# Patient Record
Sex: Female | Born: 1970 | Race: White | Hispanic: No | Marital: Married | Smoking: Never smoker
Health system: Southern US, Community
[De-identification: ages and names within clinical notes are randomized; demographics above are authoritative.]

---

## 2008-03-28 ENCOUNTER — Ambulatory Visit: Payer: Self-pay | Admitting: Obstetrics and Gynecology

## 2011-10-27 ENCOUNTER — Ambulatory Visit: Payer: Self-pay

## 2013-03-10 ENCOUNTER — Ambulatory Visit: Payer: Self-pay | Admitting: Obstetrics and Gynecology

## 2016-08-22 ENCOUNTER — Other Ambulatory Visit: Payer: Self-pay | Admitting: Obstetrics and Gynecology

## 2016-08-22 DIAGNOSIS — Z1231 Encounter for screening mammogram for malignant neoplasm of breast: Secondary | ICD-10-CM

## 2016-09-02 ENCOUNTER — Encounter: Payer: Self-pay | Admitting: Radiology

## 2016-09-02 ENCOUNTER — Encounter (INDEPENDENT_AMBULATORY_CARE_PROVIDER_SITE_OTHER): Payer: Self-pay

## 2016-09-02 ENCOUNTER — Ambulatory Visit
Admission: RE | Admit: 2016-09-02 | Discharge: 2016-09-02 | Disposition: A | Discharge status: Home / Self Care | Payer: BLUE CROSS/BLUE SHIELD | Source: Ambulatory Visit | Attending: Obstetrics and Gynecology | Admitting: Obstetrics and Gynecology

## 2016-09-02 DIAGNOSIS — Z1231 Encounter for screening mammogram for malignant neoplasm of breast: Secondary | ICD-10-CM

## 2016-09-23 ENCOUNTER — Telehealth: Payer: Self-pay | Admitting: Oncology

## 2016-09-23 NOTE — Telephone Encounter (Signed)
Consultation for LEUKOPENIA. Ref by Dr. Elenora GammaSchermerhorn,Janse. Office notified (s/w Vernona RiegerLaura). New Patient packet mailed to pt. Notes in book. MF

## 2016-10-02 DIAGNOSIS — D72819 Decreased white blood cell count, unspecified: Secondary | ICD-10-CM | POA: Insufficient documentation

## 2016-10-03 ENCOUNTER — Inpatient Hospital Stay: Payer: BLUE CROSS/BLUE SHIELD | Attending: Oncology | Admitting: Oncology

## 2016-10-03 ENCOUNTER — Inpatient Hospital Stay: Payer: BLUE CROSS/BLUE SHIELD

## 2016-10-03 ENCOUNTER — Encounter: Payer: Self-pay | Admitting: Oncology

## 2016-10-03 DIAGNOSIS — D72819 Decreased white blood cell count, unspecified: Secondary | ICD-10-CM

## 2016-10-03 LAB — COMPREHENSIVE METABOLIC PANEL
ALBUMIN: 4.6 g/dL (ref 3.5–5.0)
ALT: 13 U/L — AB (ref 14–54)
AST: 18 U/L (ref 15–41)
Alkaline Phosphatase: 50 U/L (ref 38–126)
Anion gap: 8 (ref 5–15)
BILIRUBIN TOTAL: 0.7 mg/dL (ref 0.3–1.2)
BUN: 11 mg/dL (ref 6–20)
CO2: 25 mmol/L (ref 22–32)
CREATININE: 0.47 mg/dL (ref 0.44–1.00)
Calcium: 9.1 mg/dL (ref 8.9–10.3)
Chloride: 103 mmol/L (ref 101–111)
GFR calc Af Amer: >60 mL/min (ref >60)
GLUCOSE: 87 mg/dL (ref 65–99)
POTASSIUM: 3.7 mmol/L (ref 3.5–5.1)
Sodium: 136 mmol/L (ref 135–145)
TOTAL PROTEIN: 7.7 g/dL (ref 6.5–8.1)

## 2016-10-03 LAB — CBC WITH DIFFERENTIAL/PLATELET
BASOS ABS: 0.2 10*3/uL — AB (ref 0–0.1)
BASOS PCT: 2 %
Eosinophils Absolute: 0.1 10*3/uL (ref 0–0.7)
Eosinophils Relative: 2 %
HEMATOCRIT: 37.2 % (ref 35.0–47.0)
HEMOGLOBIN: 13.3 g/dL (ref 12.0–16.0)
LYMPHS PCT: 26 %
Lymphs Abs: 1.7 10*3/uL (ref 1.0–3.6)
MCH: 34.4 pg — ABNORMAL HIGH (ref 26.0–34.0)
MCHC: 35.7 g/dL (ref 32.0–36.0)
MCV: 96.4 fL (ref 80.0–100.0)
Monocytes Absolute: 0.5 10*3/uL (ref 0.2–0.9)
Monocytes Relative: 8 %
NEUTROS ABS: 4.2 10*3/uL (ref 1.4–6.5)
NEUTROS PCT: 62 %
Platelets: 326 10*3/uL (ref 150–440)
RBC: 3.86 MIL/uL (ref 3.80–5.20)
RDW: 12.6 % (ref 11.5–14.5)
WBC: 6.8 10*3/uL (ref 3.6–11.0)

## 2016-10-03 LAB — TECHNOLOGIST SMEAR REVIEW

## 2016-10-03 LAB — LACTATE DEHYDROGENASE: LDH: 119 U/L (ref 98–192)

## 2016-10-03 NOTE — Progress Notes (Signed)
Patient here today as a new patient  

## 2016-10-04 LAB — HEPATITIS PANEL, ACUTE
HCV Ab: <0.1 s/co ratio (ref 0.0–0.9)
HEP B C IGM: NEGATIVE
HEP B S AG: NEGATIVE
Hep A IgM: NEGATIVE

## 2016-10-04 LAB — HIV ANTIBODY (ROUTINE TESTING W REFLEX): HIV Screen 4th Generation wRfx: NONREACTIVE

## 2016-10-04 LAB — ANTINUCLEAR ANTIBODIES, IFA: ANA Ab, IFA: NEGATIVE

## 2016-10-05 NOTE — Progress Notes (Signed)
Hematology/Oncology Consult note [facility] Telephone:([phone] [phone]  CONSULT NOTE Patient Care Team: Marina Goodell, MD as PCP - General (Family Medicine)  CHIEF COMPLAINTS/PURPOSE OF CONSULTATION:  My doctor tells me I have low white blood counts.    HISTORY OF PRESENTING ILLNESS:  Sara Stanton 46 y.o.  female who is referred to me by Dr.Schermerhorn for evaluation of leukopenia.  Patient feels well, at her usual health state. She has no complaints. Denies fever chills, weight loss, night sweats, lumps, chest pain, sob, abd pain, or lower extremity swelling. She denies taking any medication including herbal medication.     ROS:  Review of Systems  Constitutional: Negative.   HENT:  Negative.   Eyes: Negative.   Respiratory: Negative.   Cardiovascular: Negative.   Gastrointestinal: Negative.   Endocrine: Negative.   Genitourinary: Negative.    Musculoskeletal: Negative.   Skin: Negative.   Neurological: Negative.   Hematological: Negative.   Psychiatric/Behavioral: Negative.     MEDICAL HISTORY:  History reviewed. No pertinent past medical history.  SURGICAL HISTORY: History reviewed. No pertinent surgical history.  SOCIAL HISTORY: Social History   Social History  . Marital status: Married    Spouse name: N/A  . Number of children: N/A  . Years of education: N/A   Occupational History  . Not on file.   Social History Main Topics  . Smoking status: Never Smoker  . Smokeless tobacco: Never Used  . Alcohol use 1.2 oz/week    2 Cans of beer per week  . Drug use: No  . Sexual activity: Yes    Birth control/ protection: IUD   Other Topics Concern  . Not on file   Social History Narrative  . No narrative on file    FAMILY HISTORY: Family History  Problem Relation Age of Onset  . Thyroid cancer Mother   . Diabetes Maternal Grandfather   . Colon cancer Paternal Grandfather   . Breast cancer Neg Hx      ALLERGIES:  has No Known Allergies.  MEDICATIONS:  Current Outpatient Prescriptions  Medication Sig Dispense Refill  . levonorgestrel (MIRENA) 20 MCG/24HR IUD by Intrauterine route.     No current facility-administered medications for this visit.       Marland Kitchen  PHYSICAL EXAMINATION: ECOG PERFORMANCE STATUS: 0 - Asymptomatic Vitals:   10/03/16 1510  BP: 121/73  Pulse: 75  Resp: 16  Temp: 99.2 F (37.3 C)   Filed Weights   10/03/16 1510  Weight: 121 lb (54.9 kg)    GENERAL: Well-nourished well-developed; Alert, no distress and comfortable.  EYES: no pallor or icterus OROPHARYNX: no thrush or ulceration; good dentition  NECK: supple, no masses felt LYMPH:  no palpable lymphadenopathy in the cervical, axillary or inguinal regions LUNGS: clear to auscultation and  No wheeze or crackles HEART/CVS: regular rate & rhythm and no murmurs; No lower extremity edema ABDOMEN: abdomen soft, non-tender and normal bowel sounds Musculoskeletal:no cyanosis of digits and no clubbing  PSYCH: alert & oriented x 3  NEURO: no focal motor/sensory deficits SKIN:  no rashes or significant lesions  LABORATORY DATA:  I have reviewed the data as listed 08/22/2016 WBC 3.9, Hb 13.4, platelet 319, normal differential 09/18/2016 WBC 4, Hb 13.5, platelet 332,normal differential   RADIOGRAPHIC STUDIES: I have personally reviewed the radiological images as listed and agreed with the findings in the report. No recent images.   ASSESSMENT & PLAN:  1. Leukopenia, unspecified type   . Repeat  cbc w diff, CMP, ANA, neutrophil antibody, hepatitis panel, HIV.  Mild leukopenia mild be reactive. Patient prefers to be called if results are all normal.   All questions were answered. The patient knows to call the clinic with any problems questions or concerns.  Return of visit: as needed.  Thank you for this kind referral and the opportunity to participate in the care of this patient. A copy of today's note  is routed to referring provider    Rickard Patience, MD, PhD Hematology Oncology [facility] Pager- [phone] 10/05/2016

## 2016-10-08 LAB — NEUTROPHIL AB TEST LEVEL 1: NEUTROPHIL SCR/PANEL INTERP.: NEGATIVE

## 2016-10-10 ENCOUNTER — Telehealth: Payer: Self-pay | Admitting: Oncology

## 2016-10-10 ENCOUNTER — Other Ambulatory Visit: Payer: Self-pay | Admitting: Oncology

## 2016-10-10 DIAGNOSIS — R7989 Other specified abnormal findings of blood chemistry: Secondary | ICD-10-CM

## 2016-10-10 NOTE — Progress Notes (Signed)
Results called to patient.  Repeat cbc in 4-6 months.

## 2016-10-10 NOTE — Telephone Encounter (Signed)
-----   Message from Rickard PatienceZhou Paxtyn Wisdom, MD sent at 10/03/2016  3:23 PM EDT ----- Call with labs.

## 2016-11-01 ENCOUNTER — Encounter: Payer: Self-pay | Admitting: Oncology

## 2017-01-31 ENCOUNTER — Telehealth: Payer: Self-pay | Admitting: Oncology

## 2017-02-06 ENCOUNTER — Other Ambulatory Visit: Payer: BLUE CROSS/BLUE SHIELD

## 2017-02-06 ENCOUNTER — Inpatient Hospital Stay: Payer: BLUE CROSS/BLUE SHIELD | Attending: Oncology

## 2017-02-06 DIAGNOSIS — D72819 Decreased white blood cell count, unspecified: Secondary | ICD-10-CM | POA: Diagnosis present

## 2017-02-06 DIAGNOSIS — R7989 Other specified abnormal findings of blood chemistry: Secondary | ICD-10-CM

## 2017-02-06 LAB — CBC WITH DIFFERENTIAL/PLATELET
Basophils Absolute: 0.1 10*3/uL (ref 0–0.1)
Basophils Relative: 1 %
Eosinophils Absolute: 0.3 10*3/uL (ref 0–0.7)
Eosinophils Relative: 5 %
HEMATOCRIT: 37.6 % (ref 35.0–47.0)
HEMOGLOBIN: 13 g/dL (ref 12.0–16.0)
LYMPHS PCT: 29 %
Lymphs Abs: 1.4 10*3/uL (ref 1.0–3.6)
MCH: 33.7 pg (ref 26.0–34.0)
MCHC: 34.5 g/dL (ref 32.0–36.0)
MCV: 97.7 fL (ref 80.0–100.0)
MONO ABS: 0.5 10*3/uL (ref 0.2–0.9)
Monocytes Relative: 9 %
NEUTROS ABS: 2.7 10*3/uL (ref 1.4–6.5)
NEUTROS PCT: 56 %
Platelets: 334 10*3/uL (ref 150–440)
RBC: 3.85 MIL/uL (ref 3.80–5.20)
RDW: 12.6 % (ref 11.5–14.5)
WBC: 5 10*3/uL (ref 3.6–11.0)

## 2017-02-06 NOTE — Telephone Encounter (Signed)
Cbc results communicated with patient.  Patient was advised to continue follow up with PCP. In the future, she can call us if she needs to been seen by me.

## 2017-08-29 ENCOUNTER — Other Ambulatory Visit: Payer: Self-pay | Admitting: Obstetrics and Gynecology

## 2017-08-29 DIAGNOSIS — Z1231 Encounter for screening mammogram for malignant neoplasm of breast: Secondary | ICD-10-CM

## 2017-09-16 ENCOUNTER — Encounter: Payer: Self-pay | Admitting: Radiology

## 2017-09-16 ENCOUNTER — Ambulatory Visit
Admission: RE | Admit: 2017-09-16 | Discharge: 2017-09-16 | Disposition: A | Discharge status: Home / Self Care | Payer: BLUE CROSS/BLUE SHIELD | Source: Ambulatory Visit | Attending: Obstetrics and Gynecology | Admitting: Obstetrics and Gynecology

## 2017-09-16 DIAGNOSIS — Z1231 Encounter for screening mammogram for malignant neoplasm of breast: Secondary | ICD-10-CM | POA: Diagnosis not present

## 2018-09-03 ENCOUNTER — Other Ambulatory Visit: Payer: Self-pay | Admitting: Obstetrics and Gynecology

## 2018-09-03 DIAGNOSIS — Z1231 Encounter for screening mammogram for malignant neoplasm of breast: Secondary | ICD-10-CM

## 2018-09-14 ENCOUNTER — Other Ambulatory Visit: Payer: Self-pay | Admitting: Unknown Physician Specialty

## 2018-09-14 DIAGNOSIS — R221 Localized swelling, mass and lump, neck: Secondary | ICD-10-CM

## 2018-09-16 ENCOUNTER — Ambulatory Visit
Admission: RE | Admit: 2018-09-16 | Discharge: 2018-09-16 | Disposition: A | Discharge status: Home / Self Care | Payer: BLUE CROSS/BLUE SHIELD | Source: Ambulatory Visit | Attending: Unknown Physician Specialty | Admitting: Unknown Physician Specialty

## 2018-09-16 ENCOUNTER — Other Ambulatory Visit: Payer: Self-pay

## 2018-09-16 DIAGNOSIS — R221 Localized swelling, mass and lump, neck: Secondary | ICD-10-CM | POA: Insufficient documentation

## 2018-09-18 ENCOUNTER — Other Ambulatory Visit: Payer: Self-pay | Admitting: Unknown Physician Specialty

## 2018-09-18 DIAGNOSIS — E041 Nontoxic single thyroid nodule: Secondary | ICD-10-CM

## 2018-09-21 ENCOUNTER — Other Ambulatory Visit: Payer: Self-pay

## 2018-09-21 ENCOUNTER — Ambulatory Visit
Admission: RE | Admit: 2018-09-21 | Discharge: 2018-09-21 | Disposition: A | Discharge status: Home / Self Care | Payer: BLUE CROSS/BLUE SHIELD | Source: Ambulatory Visit | Attending: Obstetrics and Gynecology | Admitting: Obstetrics and Gynecology

## 2018-09-21 DIAGNOSIS — Z1231 Encounter for screening mammogram for malignant neoplasm of breast: Secondary | ICD-10-CM | POA: Insufficient documentation

## 2018-12-10 IMAGING — MG MM DIGITAL SCREENING BILAT W/ CAD
4 series · 4 of 4 positions shown · non-contrast
Comparison: Previous exam(s).

CLINICAL DATA: Screening.

EXAM:
DIGITAL SCREENING BILATERAL MAMMOGRAM WITH CAD

[R CC]
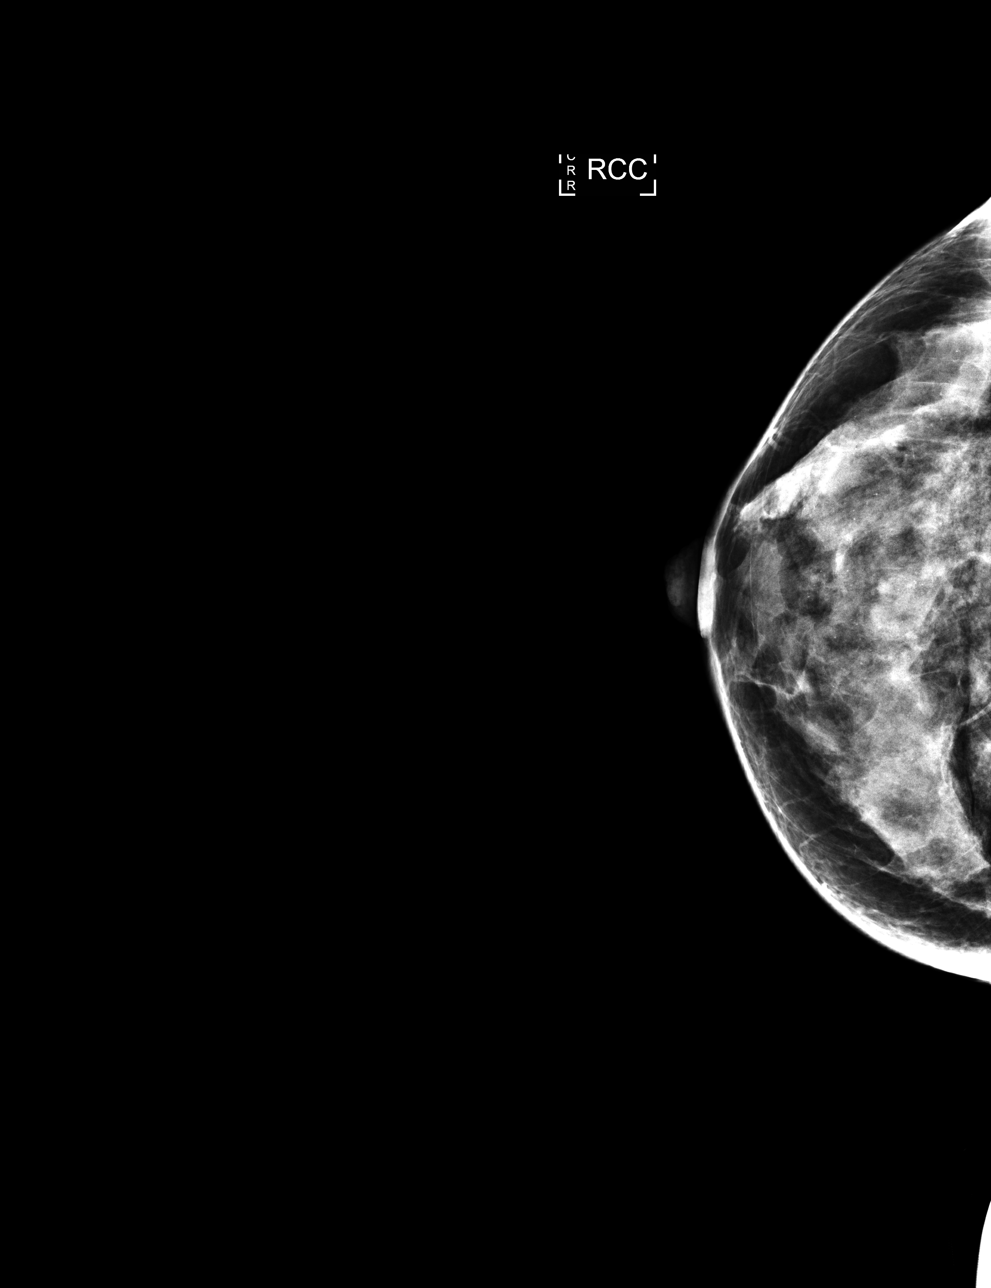

[L CC]
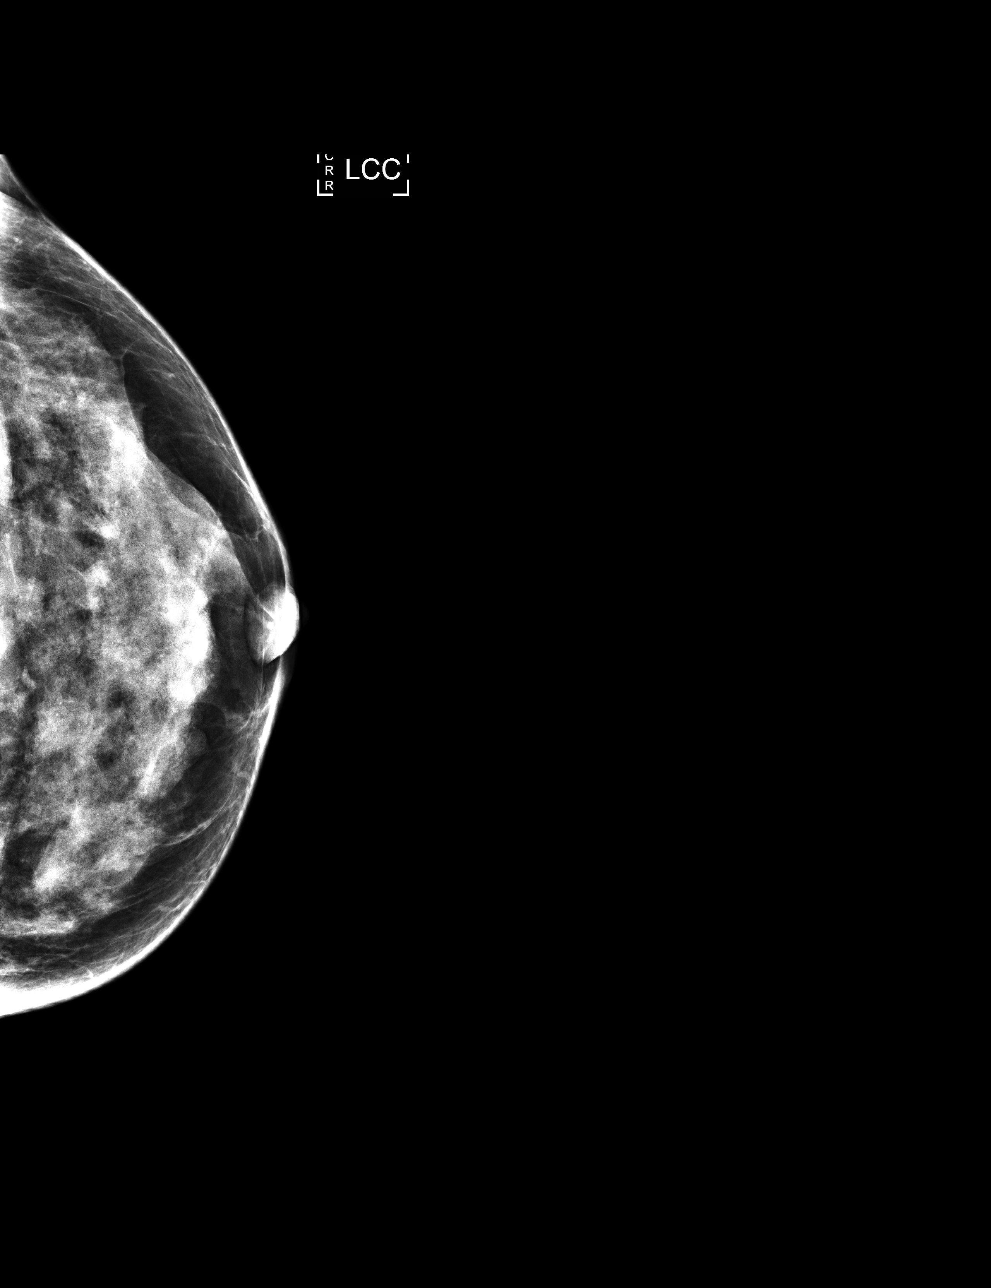

[L MLO]
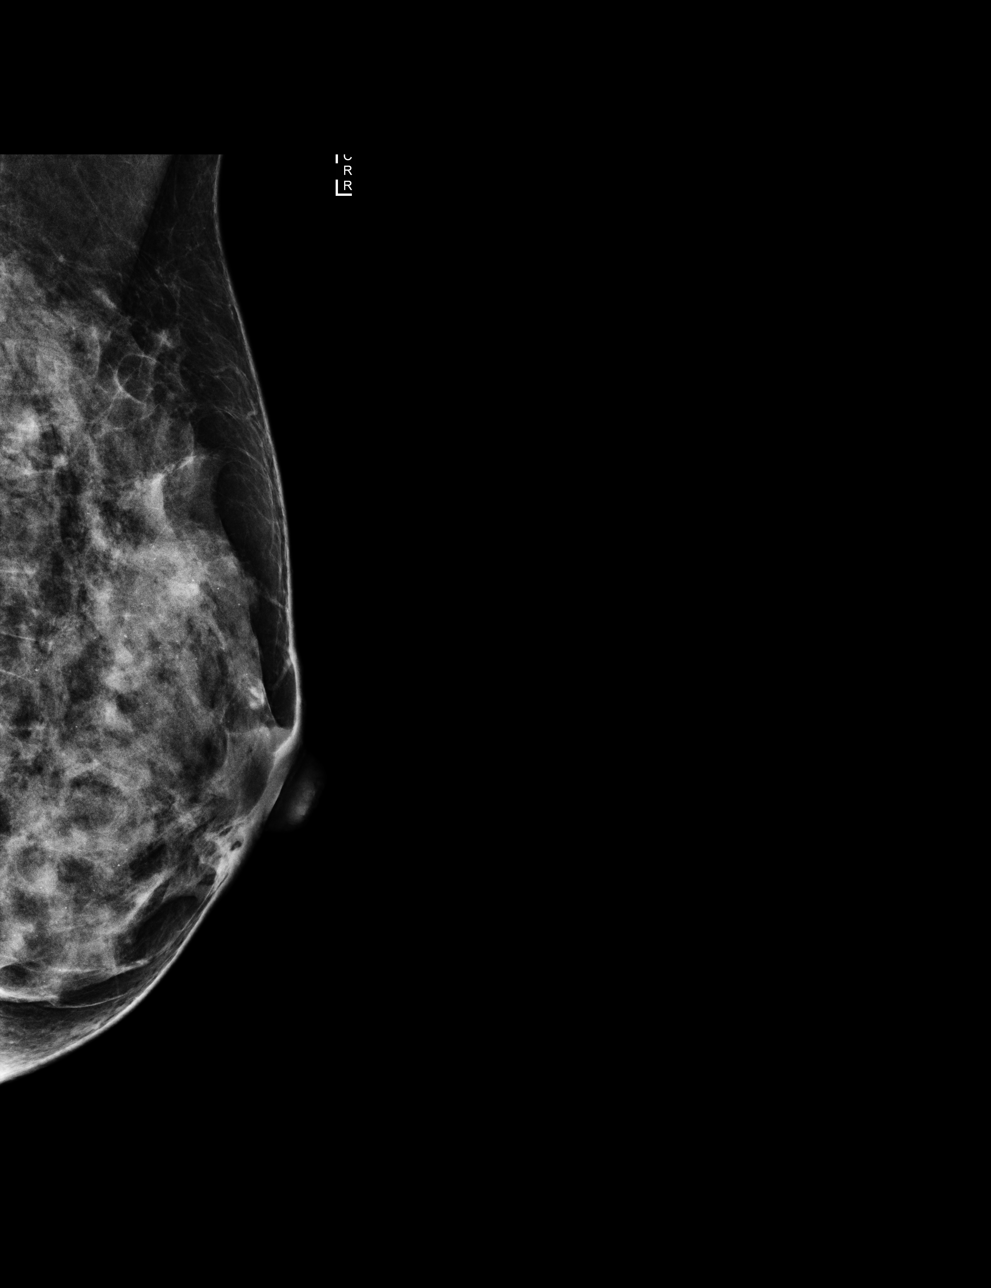

[R MLO]
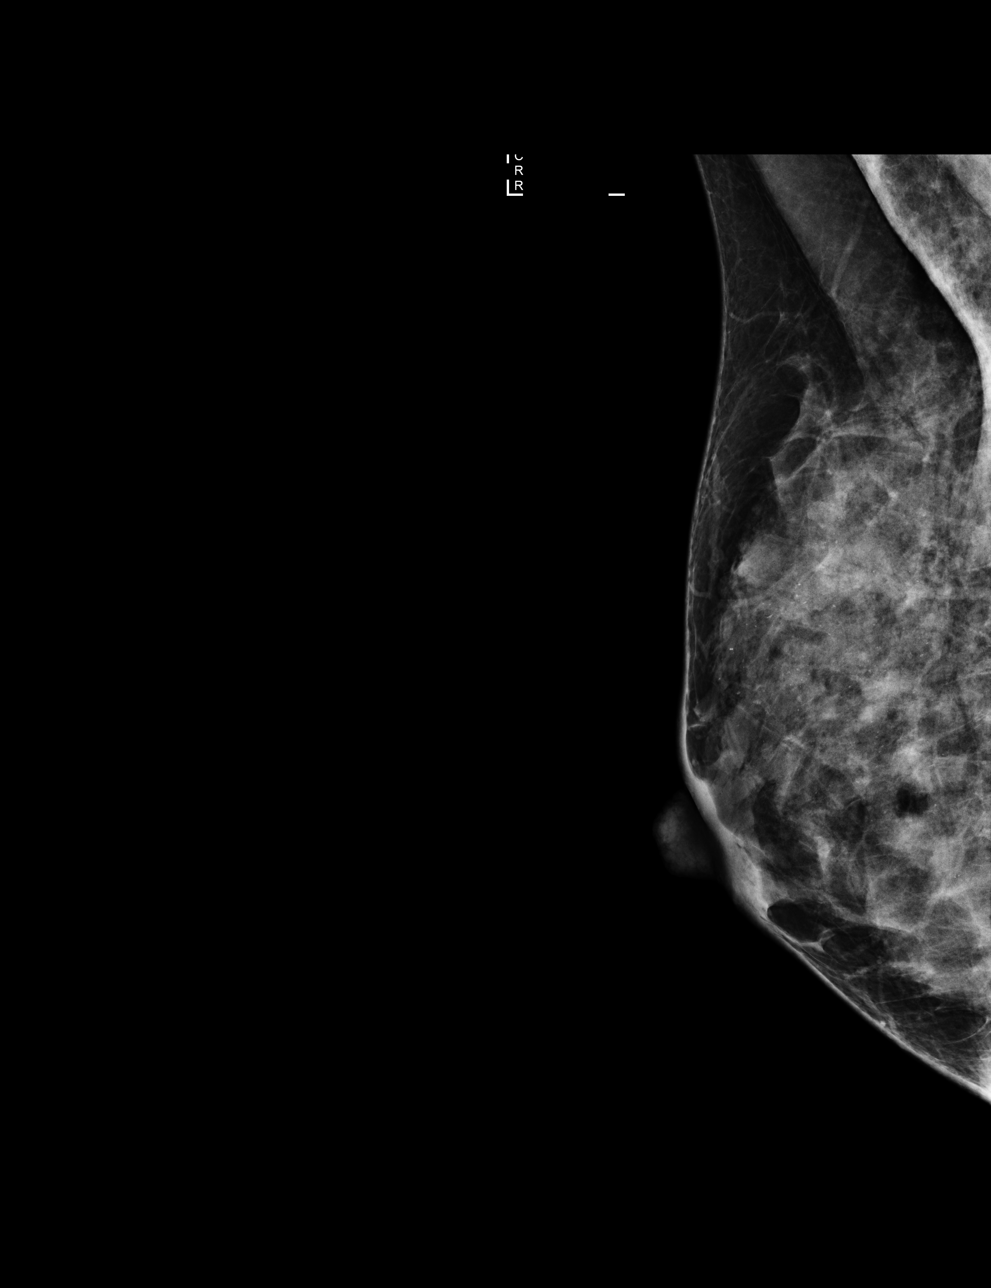

[4 of 4 positions shown; findings below may reference images not displayed]

ACR Breast Density Category d: The breast tissue is extremely dense,
which lowers the sensitivity of mammography.
FINDINGS: There are no findings suspicious for malignancy. Images were
processed with CAD.
IMPRESSION: No mammographic evidence of malignancy. A result letter of this
screening mammogram will be mailed directly to the patient.

RECOMMENDATION:
Screening mammogram in one year. (Code:[id number])

BI-RADS CATEGORY  1: Negative.

## 2019-03-22 ENCOUNTER — Ambulatory Visit: Payer: BLUE CROSS/BLUE SHIELD

## 2019-04-01 ENCOUNTER — Ambulatory Visit: Payer: BLUE CROSS/BLUE SHIELD

## 2019-04-02 ENCOUNTER — Other Ambulatory Visit: Payer: BLUE CROSS/BLUE SHIELD

## 2019-04-16 ENCOUNTER — Ambulatory Visit
Admission: RE | Admit: 2019-04-16 | Discharge: 2019-04-16 | Disposition: A | Discharge status: Home / Self Care | Payer: BC Managed Care – PPO | Source: Ambulatory Visit | Attending: Unknown Physician Specialty | Admitting: Unknown Physician Specialty

## 2019-04-16 ENCOUNTER — Other Ambulatory Visit: Payer: Self-pay

## 2019-04-16 DIAGNOSIS — E041 Nontoxic single thyroid nodule: Secondary | ICD-10-CM | POA: Insufficient documentation

## 2019-04-21 ENCOUNTER — Other Ambulatory Visit (HOSPITAL_COMMUNITY): Payer: Self-pay | Admitting: Unknown Physician Specialty

## 2019-04-21 ENCOUNTER — Other Ambulatory Visit: Payer: Self-pay | Admitting: Unknown Physician Specialty

## 2019-04-21 DIAGNOSIS — E041 Nontoxic single thyroid nodule: Secondary | ICD-10-CM

## 2020-04-19 ENCOUNTER — Other Ambulatory Visit: Payer: Self-pay

## 2020-04-19 ENCOUNTER — Ambulatory Visit
Admission: RE | Admit: 2020-04-19 | Discharge: 2020-04-19 | Disposition: A | Discharge status: Home / Self Care | Payer: BC Managed Care – PPO | Source: Ambulatory Visit | Attending: Unknown Physician Specialty | Admitting: Unknown Physician Specialty

## 2020-04-19 ENCOUNTER — Ambulatory Visit: Payer: BC Managed Care – PPO

## 2020-04-19 DIAGNOSIS — E041 Nontoxic single thyroid nodule: Secondary | ICD-10-CM

## 2020-04-21 ENCOUNTER — Other Ambulatory Visit: Payer: Self-pay | Admitting: Unknown Physician Specialty

## 2020-04-21 DIAGNOSIS — E041 Nontoxic single thyroid nodule: Secondary | ICD-10-CM

## 2021-04-03 ENCOUNTER — Other Ambulatory Visit: Payer: Self-pay | Admitting: Obstetrics and Gynecology

## 2021-04-03 DIAGNOSIS — Z1231 Encounter for screening mammogram for malignant neoplasm of breast: Secondary | ICD-10-CM

## 2021-04-19 ENCOUNTER — Ambulatory Visit
Admission: RE | Admit: 2021-04-19 | Discharge: 2021-04-19 | Disposition: A | Discharge status: Home / Self Care | Payer: 59 | Source: Ambulatory Visit | Attending: Unknown Physician Specialty | Admitting: Unknown Physician Specialty

## 2021-04-19 ENCOUNTER — Other Ambulatory Visit: Payer: Self-pay

## 2021-04-19 DIAGNOSIS — E041 Nontoxic single thyroid nodule: Secondary | ICD-10-CM | POA: Diagnosis not present

## 2021-05-28 ENCOUNTER — Ambulatory Visit
Admission: RE | Admit: 2021-05-28 | Discharge: 2021-05-28 | Disposition: A | Discharge status: Home / Self Care | Payer: 59 | Source: Ambulatory Visit | Attending: Obstetrics and Gynecology | Admitting: Obstetrics and Gynecology

## 2021-05-28 DIAGNOSIS — Z1231 Encounter for screening mammogram for malignant neoplasm of breast: Secondary | ICD-10-CM | POA: Insufficient documentation

## 2022-05-27 ENCOUNTER — Other Ambulatory Visit: Payer: Self-pay | Admitting: Obstetrics and Gynecology

## 2022-05-27 DIAGNOSIS — Z1231 Encounter for screening mammogram for malignant neoplasm of breast: Secondary | ICD-10-CM

## 2022-07-02 ENCOUNTER — Ambulatory Visit
Admission: RE | Admit: 2022-07-02 | Discharge: 2022-07-02 | Disposition: A | Discharge status: Home / Self Care | Payer: Managed Care, Other (non HMO) | Source: Ambulatory Visit | Attending: Obstetrics and Gynecology | Admitting: Obstetrics and Gynecology

## 2022-07-02 DIAGNOSIS — Z1231 Encounter for screening mammogram for malignant neoplasm of breast: Secondary | ICD-10-CM | POA: Diagnosis present

## 2023-04-08 ENCOUNTER — Other Ambulatory Visit: Payer: Self-pay | Admitting: Obstetrics and Gynecology

## 2023-04-08 DIAGNOSIS — Z1231 Encounter for screening mammogram for malignant neoplasm of breast: Secondary | ICD-10-CM

## 2023-09-10 ENCOUNTER — Ambulatory Visit
Admission: RE | Admit: 2023-09-10 | Discharge: 2023-09-10 | Disposition: A | Discharge status: Home / Self Care | Source: Ambulatory Visit | Attending: Obstetrics and Gynecology | Admitting: Obstetrics and Gynecology

## 2023-09-10 DIAGNOSIS — Z1231 Encounter for screening mammogram for malignant neoplasm of breast: Secondary | ICD-10-CM | POA: Insufficient documentation

## 2023-10-07 ENCOUNTER — Ambulatory Visit (INDEPENDENT_AMBULATORY_CARE_PROVIDER_SITE_OTHER)

## 2023-10-07 DIAGNOSIS — L578 Other skin changes due to chronic exposure to nonionizing radiation: Secondary | ICD-10-CM

## 2023-10-07 DIAGNOSIS — D1801 Hemangioma of skin and subcutaneous tissue: Secondary | ICD-10-CM

## 2023-10-07 DIAGNOSIS — W908XXA Exposure to other nonionizing radiation, initial encounter: Secondary | ICD-10-CM

## 2023-10-07 DIAGNOSIS — L821 Other seborrheic keratosis: Secondary | ICD-10-CM | POA: Diagnosis not present

## 2023-10-07 DIAGNOSIS — Z1283 Encounter for screening for malignant neoplasm of skin: Secondary | ICD-10-CM | POA: Diagnosis not present

## 2023-10-07 DIAGNOSIS — L570 Actinic keratosis: Secondary | ICD-10-CM

## 2023-10-07 DIAGNOSIS — D229 Melanocytic nevi, unspecified: Secondary | ICD-10-CM

## 2023-10-07 DIAGNOSIS — H61119 Acquired deformity of pinna, unspecified ear: Secondary | ICD-10-CM

## 2023-10-07 DIAGNOSIS — S01319A Laceration without foreign body of unspecified ear, initial encounter: Secondary | ICD-10-CM

## 2023-10-07 DIAGNOSIS — L814 Other melanin hyperpigmentation: Secondary | ICD-10-CM | POA: Diagnosis not present

## 2023-10-07 DIAGNOSIS — L84 Corns and callosities: Secondary | ICD-10-CM

## 2023-10-07 NOTE — Patient Instructions (Addendum)
 We recommend an over-the-counter lotion called Amlactin.  This contains a mild acid exfoliant which can help minimize skin cell build-up.     Cryotherapy Aftercare  Wash gently with soap and water everyday.   Apply Vaseline and Band-Aid daily until healed.   Recommend daily broad spectrum sunscreen SPF 30+ to sun-exposed areas, reapply every 2 hours as needed. Call for new or changing lesions.  Staying in the shade or wearing long sleeves, sun glasses (UVA+UVB protection) and wide brim hats (4-inch brim around the entire circumference of the hat) are also recommended for sun protection.    Melanoma ABCDEs  Melanoma is the most dangerous type of skin cancer, and is the leading cause of death from skin disease.  You are more likely to develop melanoma if you: Have light-colored skin, light-colored eyes, or red or blond hair Spend a lot of time in the sun Tan regularly, either outdoors or in a tanning bed Have had blistering sunburns, especially during childhood Have a close family member who has had a melanoma Have atypical moles or large birthmarks  Early detection of melanoma is key since treatment is typically straightforward and cure rates are extremely high if we catch it early.   The first sign of melanoma is often a change in a mole or a new dark spot.  The ABCDE system is a way of remembering the signs of melanoma.  A for asymmetry:  The two halves do not match. B for border:  The edges of the growth are irregular. C for color:  A mixture of colors are present instead of an even brown color. D for diameter:  Melanomas are usually (but not always) greater than 6mm - the size of a pencil eraser. E for evolution:  The spot keeps changing in size, shape, and color.  Please check your skin once per month between visits. You can use a small mirror in front and a large mirror behind you to keep an eye on the back side or your body.   If you see any new or changing lesions before  your next follow-up, please call to schedule a visit.  Please continue daily skin protection including broad spectrum sunscreen SPF 30+ to sun-exposed areas, reapplying every 2 hours as needed when you're outdoors.    Due to recent changes in healthcare laws, you may see results of your pathology and/or laboratory studies on MyChart before the doctors have had a chance to review them. We understand that in some cases there may be results that are confusing or concerning to you. Please understand that not all results are received at the same time and often the doctors may need to interpret multiple results in order to provide you with the best plan of care or course of treatment. Therefore, we ask that you please give us  2 business days to thoroughly review all your results before contacting the office for clarification. Should we see a critical lab result, you will be contacted sooner.   If You Need Anything After Your Visit  If you have any questions or concerns for your doctor, please call our main line at [phone] and press option 4 to reach your doctor's medical assistant. If no one answers, please leave a voicemail as directed and we will return your call as soon as possible. Messages left after 4 pm will be answered the following business day.   You may also send us  a message via MyChart. We typically respond to MyChart messages within 1-2  business days.  For prescription refills, please ask your pharmacy to contact our office. Our fax number is [phone].  If you have an urgent issue when the clinic is closed that cannot wait until the next business day, you can page your doctor at the number below.    Please note that while we do our best to be available for urgent issues outside of office hours, we are not available 24/7.   If you have an urgent issue and are unable to reach us , you may choose to seek medical care at your doctor's office, retail clinic, urgent care center, or  emergency room.  If you have a medical emergency, please immediately call 911 or go to the emergency department.  Pager Numbers  - Dr. Hester: [phone]  - Dr. Jackquline: [phone]  - Dr. Claudene: [phone]   - Dr. Raymund: [phone]  In the event of inclement weather, please call our main line at [phone] for an update on the status of any delays or closures.  Dermatology Medication Tips: Please keep the boxes that topical medications come in in order to help keep track of the instructions about where and how to use these. Pharmacies typically print the medication instructions only on the boxes and not directly on the medication tubes.   If your medication is too expensive, please contact our office at [phone] option 4 or send us  a message through MyChart.   We are unable to tell what your co-pay for medications will be in advance as this is different depending on your insurance coverage. However, we may be able to find a substitute medication at lower cost or fill out paperwork to get insurance to cover a needed medication.   If a prior authorization is required to get your medication covered by your insurance company, please allow us  1-2 business days to complete this process.  Drug prices often vary depending on where the prescription is filled and some pharmacies may offer cheaper prices.  The website www.goodrx.com contains coupons for medications through different pharmacies. The prices here do not account for what the cost may be with help from insurance (it may be cheaper with your insurance), but the website can give you the price if you did not use any insurance.  - You can print the associated coupon and take it with your prescription to the pharmacy.  - You may also stop by our office during regular business hours and pick up a GoodRx coupon card.  - If you need your prescription sent electronically to a different pharmacy, notify our office through [facility] or by phone at [phone] option 4.     Si Usted Necesita Algo Despus de Su Visita  Tambin puede enviarnos un mensaje a travs de Clinical cytogeneticist. Por lo general respondemos a los mensajes de MyChart en el transcurso de 1 a 2 das hbiles.  Para renovar recetas, por favor pida a su farmacia que se ponga en contacto con nuestra oficina. Randi lakes de fax es [place] [phone].  Si tiene un asunto urgente cuando la clnica est cerrada y que no puede esperar hasta el siguiente da hbil, puede llamar/localizar a su doctor(a) al nmero que aparece a continuacin.   Por favor, tenga en cuenta que aunque hacemos todo lo posible para estar disponibles para asuntos urgentes fuera del horario de [place], no estamos disponibles las 24 horas del da, los 7 [address]  [address] de la [place].   Si tiene un problema urgente y no puede comunicarse con  nosotros, puede optar por buscar atencin mdica  en el consultorio de su doctor(a), en una clnica privada, en un centro de atencin urgente o en una sala de emergencias.  Si tiene Engineer, drilling, por favor llame inmediatamente al 911 o vaya a la sala de emergencias.  Nmeros de bper  - Dr. Hester: [phone]  - Dra. Jackquline: [phone]  - Dr. Claudene: [phone]  - Dra. Kitts: [phone]  En caso de inclemencias del [place], por favor llame a nuestra lnea principal al [phone] para una actualizacin sobre el estado de cualquier retraso o cierre.  Consejos para la medicacin en dermatologa: Por favor, guarde las cajas en las que vienen los medicamentos de uso tpico para ayudarle a seguir las instrucciones sobre dnde y cmo usarlos. Las farmacias generalmente imprimen las instrucciones del medicamento slo en las cajas y no directamente en los tubos del [place].   Si su medicamento es muy caro, por favor, pngase en contacto con landry rieger llamando al [phone] y presione la opcin 4 o envenos un mensaje a travs de  Clinical cytogeneticist.   No podemos decirle cul ser su copago por los medicamentos por adelantado ya que esto es diferente dependiendo de la cobertura de su seguro. Sin embargo, es posible que podamos encontrar un medicamento sustituto a Audiological scientist un formulario para que el seguro cubra el medicamento que se considera necesario.   Si se requiere una autorizacin previa para que su compaa de seguros malta su medicamento, por favor permtanos de 1 a 2 das hbiles para completar este proceso.  Los precios de los medicamentos varan con frecuencia dependiendo del Environmental consultant de dnde se surte la receta y alguna farmacias pueden ofrecer precios ms baratos.  El sitio web www.goodrx.com tiene cupones para medicamentos de Health and safety inspector. Los precios aqu no tienen en cuenta lo que podra costar con la ayuda del seguro (puede ser ms barato con su seguro), pero el sitio web puede darle el precio si no utiliz Tourist information centre manager.  - Puede imprimir el cupn correspondiente y llevarlo con su receta a la farmacia.  - Tambin puede pasar por nuestra oficina durante el horario de atencin regular y Education officer, museum una tarjeta de cupones de GoodRx.  - Si necesita que su receta se enve electrnicamente a una farmacia diferente, informe a nuestra oficina a travs de MyChart de [place] o por telfono llamando al [phone] y presione la opcin 4.

## 2023-10-07 NOTE — Progress Notes (Signed)
 New Patient Visit   Subjective  Sara Stanton is a 53 y.o. female who presents for the following: Skin Cancer Screening and Full Body Skin Exam; no personal or family hx of skin cancer. Patient has two areas of concern on her right thigh.   The patient presents for Total-Body Skin Exam (TBSE) for skin cancer screening and mole check. The patient has spots, moles and lesions to be evaluated, some may be new or changing and the patient may have concern these could be cancer.  Also with stretched earlobes bilaterally from earrings.   The following portions of the chart were reviewed this encounter and updated as appropriate: medications, allergies, medical history  Review of Systems:  No other skin or systemic complaints except as noted in HPI or Assessment and Plan.  Objective  Well appearing patient in no apparent distress; mood and affect are within normal limits.  A full examination was performed including scalp, head, eyes, ears, nose, lips, neck, chest, axillae, abdomen, back, buttocks, bilateral upper extremities, bilateral lower extremities, hands, feet, fingers, toes, fingernails, and toenails. All findings within normal limits unless otherwise noted below.   Relevant physical exam findings are noted in the Assessment and Plan.  Exam of nails limited by presence of nail polish.   Right Upper Arm - Posterior x1 Pink scaly macules  Assessment & Plan   SKIN CANCER SCREENING PERFORMED TODAY.  ACTINIC DAMAGE - Chronic condition, secondary to cumulative UV/sun exposure - Recommend daily broad spectrum sunscreen SPF 30+ to sun-exposed areas, reapply every 2 hours as needed.  - Staying in the shade or wearing long sleeves, sun glasses (UVA+UVB protection) and wide brim hats (4-inch brim around the entire circumference of the hat) are also recommended for sun protection.  - Call for new or changing lesions.  LENTIGINES, SEBORRHEIC KERATOSES, HEMANGIOMAS - Benign normal skin  lesions - Benign-appearing - Call for any changes  MELANOCYTIC NEVI - Tan-brown and/or pink-flesh-colored symmetric macules and papules - Benign appearing on exam today - Observation - Call clinic for new or changing moles - Recommend daily use of broad spectrum spf 30+ sunscreen to sun-exposed areas.   Cherry angiomas  - Reassured patient that these are benign lesions, not requiring biopsy. - Explained that more lesions may appear over time  Corn involving L foot Discussed intractable plantar keratoses, mechanical causes, remedies (shoes, padding, orthotics, surgery).  Recommend OTC Amlactin   - paired down w/ 3mm currette in clinic   Stretched earlobe/piercing  - photos in chart - discussed surgical correction, potential out of pocket cosmetic costs  - will message Dr. Corey re correction with lobuloplasty   ACTINIC KERATOSIS Right Upper Arm - Posterior x1 Actinic keratoses are precancerous spots that appear secondary to cumulative UV radiation exposure/sun exposure over time. They are chronic with expected duration over 1 year. A portion of actinic keratoses will progress to squamous cell carcinoma of the skin. It is not possible to reliably predict which spots will progress to skin cancer and so treatment is recommended to prevent development of skin cancer.  Recommend daily broad spectrum sunscreen SPF 30+ to sun-exposed areas, reapply every 2 hours as needed.  Recommend staying in the shade or wearing long sleeves, sun glasses (UVA+UVB protection) and wide brim hats (4-inch brim around the entire circumference of the hat). Call for new or changing lesions. Destruction of lesion - Right Upper Arm - Posterior x1  Destruction method: cryotherapy   Informed consent: discussed and consent obtained  Lesion destroyed using liquid nitrogen: Yes   Region frozen until ice ball extended beyond lesion: Yes   Outcome: patient tolerated procedure well with no complications    Post-procedure details: wound care instructions given   Additional details:  Prior to procedure, discussed risks of blister formation, small wound, skin dyspigmentation, or rare scar following cryotherapy. Recommend Vaseline ointment to treated areas while healing.    Return in about 1 year (around 10/06/2024) for TBSE.  I, Emerick Ege, CMA am acting as scribe for Lauraine JAYSON Kanaris, MD.   Documentation: I have reviewed the above documentation for accuracy and completeness, and I agree with the above.  Lauraine JAYSON Kanaris, MD

## 2024-10-06 ENCOUNTER — Ambulatory Visit
